# Patient Record
Sex: Female | Born: 1970 | Race: Black or African American | Hispanic: No | Marital: Single | Smoking: Heavy tobacco smoker
Health system: Southern US, Community
[De-identification: ages and names within clinical notes are randomized; demographics above are authoritative.]

## PROBLEM LIST (undated history)

## (undated) DIAGNOSIS — K589 Irritable bowel syndrome without diarrhea: Secondary | ICD-10-CM

## (undated) DIAGNOSIS — Z5189 Encounter for other specified aftercare: Secondary | ICD-10-CM

## (undated) DIAGNOSIS — G5600 Carpal tunnel syndrome, unspecified upper limb: Secondary | ICD-10-CM

## (undated) HISTORY — PX: ABDOMINAL HYSTERECTOMY: SHX81

## (undated) HISTORY — DX: Irritable bowel syndrome, unspecified: K58.9

## (undated) HISTORY — DX: Carpal tunnel syndrome, unspecified upper limb: G56.00

## (undated) HISTORY — PX: SPINE SURGERY: SHX786

---

## 2011-03-26 ENCOUNTER — Emergency Department (HOSPITAL_COMMUNITY): Payer: No Typology Code available for payment source

## 2011-03-26 ENCOUNTER — Emergency Department (HOSPITAL_COMMUNITY)
Admission: EM | Admit: 2011-03-26 | Discharge: 2011-03-26 | Disposition: A | Discharge status: Home / Self Care | Payer: No Typology Code available for payment source | Attending: Emergency Medicine | Admitting: Emergency Medicine

## 2011-03-26 DIAGNOSIS — M545 Low back pain, unspecified: Secondary | ICD-10-CM | POA: Insufficient documentation

## 2011-03-26 DIAGNOSIS — G8929 Other chronic pain: Secondary | ICD-10-CM | POA: Insufficient documentation

## 2011-03-26 DIAGNOSIS — M542 Cervicalgia: Secondary | ICD-10-CM | POA: Insufficient documentation

## 2011-03-26 DIAGNOSIS — Z79899 Other long term (current) drug therapy: Secondary | ICD-10-CM | POA: Insufficient documentation

## 2014-04-16 ENCOUNTER — Encounter: Payer: Self-pay | Admitting: Internal Medicine

## 2014-04-16 ENCOUNTER — Telehealth: Payer: Self-pay

## 2014-04-16 ENCOUNTER — Ambulatory Visit: Payer: Medicaid Other | Attending: Internal Medicine | Admitting: Internal Medicine

## 2014-04-16 VITALS — BP 130/91 | HR 63 | Temp 98.4°F | Resp 17 | Wt 120.8 lb

## 2014-04-16 DIAGNOSIS — G894 Chronic pain syndrome: Secondary | ICD-10-CM | POA: Insufficient documentation

## 2014-04-16 DIAGNOSIS — K219 Gastro-esophageal reflux disease without esophagitis: Secondary | ICD-10-CM | POA: Diagnosis not present

## 2014-04-16 DIAGNOSIS — Z139 Encounter for screening, unspecified: Secondary | ICD-10-CM

## 2014-04-16 DIAGNOSIS — Z9889 Other specified postprocedural states: Secondary | ICD-10-CM | POA: Insufficient documentation

## 2014-04-16 DIAGNOSIS — G4700 Insomnia, unspecified: Secondary | ICD-10-CM | POA: Insufficient documentation

## 2014-04-16 DIAGNOSIS — D649 Anemia, unspecified: Secondary | ICD-10-CM | POA: Insufficient documentation

## 2014-04-16 DIAGNOSIS — K589 Irritable bowel syndrome without diarrhea: Secondary | ICD-10-CM | POA: Insufficient documentation

## 2014-04-16 DIAGNOSIS — Z862 Personal history of diseases of the blood and blood-forming organs and certain disorders involving the immune mechanism: Secondary | ICD-10-CM | POA: Insufficient documentation

## 2014-04-16 DIAGNOSIS — F17210 Nicotine dependence, cigarettes, uncomplicated: Secondary | ICD-10-CM | POA: Insufficient documentation

## 2014-04-16 DIAGNOSIS — F17200 Nicotine dependence, unspecified, uncomplicated: Secondary | ICD-10-CM

## 2014-04-16 DIAGNOSIS — G5600 Carpal tunnel syndrome, unspecified upper limb: Secondary | ICD-10-CM | POA: Insufficient documentation

## 2014-04-16 DIAGNOSIS — F419 Anxiety disorder, unspecified: Secondary | ICD-10-CM | POA: Insufficient documentation

## 2014-04-16 DIAGNOSIS — F418 Other specified anxiety disorders: Secondary | ICD-10-CM | POA: Insufficient documentation

## 2014-04-16 DIAGNOSIS — Z720 Tobacco use: Secondary | ICD-10-CM

## 2014-04-16 MED ORDER — NICOTINE POLACRILEX 4 MG MT GUM: 4.0000 mg | CHEWING_GUM | OROMUCOSAL | Status: AC | PRN

## 2014-04-16 NOTE — Progress Notes (Signed)
Patient here to establish care Has history of hysterectomy , IBS and carpal tunnel [place] into her [place]  Patient refused flu vaccine

## 2014-04-16 NOTE — Telephone Encounter (Signed)
left vm for patient regarding appt with Dr. Clearance CootsHarper 11/24 at 2:30pm
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
## 2014-04-17 ENCOUNTER — Telehealth: Payer: Self-pay | Admitting: *Deleted

## 2014-04-17 LAB — ANEMIA PANEL
%SAT: 16 % — AB (ref 20–55)
ABS Retic: 43.4 10*3/uL (ref 19.0–186.0)
FOLATE: 13.6 ng/mL
Ferritin: 123 ng/mL (ref 10–291)
IRON: 59 ug/dL (ref 42–145)
RBC.: 4.34 MIL/uL (ref 3.87–5.11)
RETIC CT PCT: 1 % (ref 0.4–2.3)
TIBC: 362 ug/dL (ref 250–470)
UIBC: 303 ug/dL (ref 125–400)
VITAMIN B 12: 606 pg/mL (ref 211–911)

## 2014-04-17 NOTE — Telephone Encounter (Signed)
LCSW spoke with patient about psychiatry medications. Patient stated that she needed all of her medications.  LCSW provided clarity that mental health medications should be managed by a psychiatrist and that pain medications would be managed by a pain management specialist and that general health medications will be managed by the PCP. LCSW also shared with patient that her PCP had made referral to pain management and they will contact her about the referral. LCSW provided the name of the clinic so that patient will be aware.  LCSW encouraged patient to contact medicaid directly [facility] to identify which psychiatrist accept medicaid.   Please follow up with patient about refills on her other medications.  Beverly Sessionsywan J Angelica Smith MSW, LCSW

## 2014-05-22 ENCOUNTER — Encounter (HOSPITAL_COMMUNITY): Payer: Self-pay | Admitting: Emergency Medicine

## 2014-05-22 ENCOUNTER — Emergency Department (HOSPITAL_COMMUNITY)
Admission: EM | Admit: 2014-05-22 | Discharge: 2014-05-22 | Disposition: A | Discharge status: Home / Self Care | Payer: Medicaid Other | Attending: Emergency Medicine | Admitting: Emergency Medicine

## 2014-05-22 ENCOUNTER — Telehealth: Payer: Self-pay | Admitting: Internal Medicine

## 2014-05-22 DIAGNOSIS — M545 Low back pain: Secondary | ICD-10-CM | POA: Insufficient documentation

## 2014-05-22 DIAGNOSIS — Z9889 Other specified postprocedural states: Secondary | ICD-10-CM | POA: Diagnosis not present

## 2014-05-22 DIAGNOSIS — G8918 Other acute postprocedural pain: Secondary | ICD-10-CM | POA: Diagnosis not present

## 2014-05-22 DIAGNOSIS — Z720 Tobacco use: Secondary | ICD-10-CM | POA: Diagnosis not present

## 2014-05-22 DIAGNOSIS — Z8719 Personal history of other diseases of the digestive system: Secondary | ICD-10-CM | POA: Insufficient documentation

## 2014-05-22 DIAGNOSIS — Z7951 Long term (current) use of inhaled steroids: Secondary | ICD-10-CM | POA: Insufficient documentation

## 2014-05-22 DIAGNOSIS — Z79899 Other long term (current) drug therapy: Secondary | ICD-10-CM | POA: Diagnosis not present

## 2014-05-22 DIAGNOSIS — Z8669 Personal history of other diseases of the nervous system and sense organs: Secondary | ICD-10-CM | POA: Insufficient documentation

## 2014-05-22 DIAGNOSIS — G8929 Other chronic pain: Secondary | ICD-10-CM | POA: Insufficient documentation

## 2014-05-22 MED ORDER — HYDROMORPHONE HCL 1 MG/ML IJ SOLN
1.0000 mg | Freq: Once | INTRAMUSCULAR | Status: AC
  Administered 2014-05-22: 1 mg via INTRAVENOUS
  Filled 2014-05-22: qty 1

## 2014-05-22 MED ORDER — LORAZEPAM 2 MG/ML IJ SOLN
0.5000 mg | Freq: Once | INTRAMUSCULAR | Status: AC
  Administered 2014-05-22: 0.5 mg via INTRAVENOUS
  Filled 2014-05-22: qty 1

## 2014-05-22 MED ORDER — ONDANSETRON HCL 4 MG/2ML IJ SOLN
4.0000 mg | Freq: Once | INTRAMUSCULAR | Status: AC
  Administered 2014-05-22: 4 mg via INTRAVENOUS
  Filled 2014-05-22: qty 2

## 2014-05-22 MED ORDER — KETOROLAC TROMETHAMINE 15 MG/ML IJ SOLN
15.0000 mg | Freq: Once | INTRAMUSCULAR | Status: AC
  Administered 2014-05-22: 15 mg via INTRAVENOUS
  Filled 2014-05-22: qty 1

## 2014-05-22 MED ORDER — SODIUM CHLORIDE 0.9 % IV BOLUS (SEPSIS)
1000.0000 mL | Freq: Once | INTRAVENOUS | Status: AC
  Administered 2014-05-22: 1000 mL via INTRAVENOUS

## 2014-05-22 MED ORDER — HYDROMORPHONE HCL 1 MG/ML IJ SOLN
0.5000 mg | Freq: Once | INTRAMUSCULAR | Status: AC
  Administered 2014-05-22: 0.5 mg via INTRAVENOUS
  Filled 2014-05-22: qty 1

## 2014-05-22 MED ORDER — DIAZEPAM 5 MG PO TABS: 5.0000 mg | ORAL_TABLET | Freq: Four times a day (QID) | ORAL | Status: DC | PRN

## 2014-05-22 MED ORDER — OXYCODONE HCL ER 20 MG PO T12A: 20.0000 mg | EXTENDED_RELEASE_TABLET | Freq: Two times a day (BID) | ORAL | Status: DC

## 2014-05-22 MED ORDER — PREGABALIN 75 MG PO CAPS: 75.0000 mg | ORAL_CAPSULE | Freq: Two times a day (BID) | ORAL | Status: DC

## 2014-05-22 MED ORDER — OXYMORPHONE HCL ER 10 MG PO TB12: 10.0000 mg | ORAL_TABLET | Freq: Two times a day (BID) | ORAL | Status: DC

## 2014-05-22 NOTE — Discharge Instructions (Signed)
Back Pain, Adult Low back pain is very common. About 1 in 5 people have back pain.The cause of low back pain is rarely dangerous. The pain often gets better over time.About half of people with a sudden onset of back pain feel better in just 2 weeks. About 8 in 10 people feel better by 6 weeks.  CAUSES Some common causes of back pain include:  Strain of the muscles or ligaments supporting the spine.  Wear and tear (degeneration) of the spinal discs.  Arthritis.  Direct injury to the back. DIAGNOSIS Most of the time, the direct cause of low back pain is not known.However, back pain can be treated effectively even when the exact cause of the pain is unknown.Answering your caregiver's questions about your overall health and symptoms is one of the most accurate ways to make sure the cause of your pain is not dangerous. If your caregiver needs more information, he or she may order lab work or imaging tests (X-rays or MRIs).However, even if imaging tests show changes in your back, this usually does not require surgery. HOME CARE INSTRUCTIONS For many people, back pain returns.Since low back pain is rarely dangerous, it is often a condition that people can learn to manageon their own.   Remain active. It is stressful on the back to sit or stand in one place. Do not sit, drive, or stand in one place for more than 30 minutes at a time. Take short walks on level surfaces as soon as pain allows.Try to increase the length of time you walk each day.  Do not stay in bed.Resting more than 1 or 2 days can delay your recovery.  Do not avoid exercise or work.Your body is made to move.It is not dangerous to be active, even though your back may hurt.Your back will likely heal faster if you return to being active before your pain is gone.  Pay attention to your body when you bend and lift. Many people have less discomfortwhen lifting if they bend their knees, keep the load close to their bodies,and  avoid twisting. Often, the most comfortable positions are those that put less stress on your recovering back.  Find a comfortable position to sleep. Use a firm mattress and lie on your side with your knees slightly bent. If you lie on your back, put a pillow under your knees.  Only take over-the-counter or prescription medicines as directed by your caregiver. Over-the-counter medicines to reduce pain and inflammation are often the most helpful.Your caregiver may prescribe muscle relaxant drugs.These medicines help dull your pain so you can more quickly return to your normal activities and healthy exercise.  Put ice on the injured area.  Put ice in a plastic bag.  Place a towel between your skin and the bag.  Leave the ice on for 15-20 minutes, 03-04 times a day for the first 2 to 3 days. After that, ice and heat may be alternated to reduce pain and spasms.  Ask your caregiver about trying back exercises and gentle massage. This may be of some benefit.  Avoid feeling anxious or stressed.Stress increases muscle tension and can worsen back pain.It is important to recognize when you are anxious or stressed and learn ways to manage it.Exercise is a great option. SEEK MEDICAL CARE IF:  You have pain that is not relieved with rest or medicine.  You have pain that does not improve in 1 week.  You have new symptoms.  You are generally not feeling well. SEEK   IMMEDIATE MEDICAL CARE IF:   You have pain that radiates from your back into your legs.  You develop new bowel or bladder control problems.  You have unusual weakness or numbness in your arms or legs.  You develop nausea or vomiting.  You develop abdominal pain.  You feel faint. Document Released: 06/28/2005 Document Revised: 12/28/2011 Document Reviewed: 10/30/2013 ExitCare Patient Information 2015 [employer]. This information is not intended to replace advice given to you by your health care provider. Make sure you  discuss any questions you have with your health care provider.  

## 2014-05-22 NOTE — Telephone Encounter (Signed)
Pt's sister is calling to request another referral to a different pain management facility([facility]), pt's sister states that pt. Is currently in hospital due to the injections the pain management doctor gave the pt., Pt's sister would like to get a referral to [facility], or [facility]. Please f/u with family member.

## 2014-05-22 NOTE — ED Notes (Signed)
Bed: [id number] Expected date:  Expected time:  Means of arrival:  Comments: EMS- back pain, recently outpatient procedure

## 2014-05-22 NOTE — ED Notes (Addendum)
Per EMS pt comes from home c/o back pain after having radiofrequency to L[facility] and L5 [facility].  Pt states that she wasn't given any pain meds.  Pt laying on her left side and unable to lay supine position.  Pt states that this morning she was unable to get out of bed.  Pt was given 150mcg Fentanyl in route. After last dose of Fentanyl pt's BP dropped to 88/58 sp pt was given 500ml NS bolus. Then BP came up to 108/56
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
## 2014-05-24 NOTE — Telephone Encounter (Signed)
Expand All Collapse All   Pt's sister is calling to request another referral to a different pain management facility([facility]), pt's sister states that pt. Is currently in hospital due to the injections the pain management doctor gave the pt., Pt's sister would like to get a referral to [facility], or [facility]. Please f/u with family member.             Please f/u

## 2014-05-31 ENCOUNTER — Telehealth: Payer: Self-pay | Admitting: Internal Medicine

## 2014-05-31 NOTE — Telephone Encounter (Signed)
Pt. Came into facility wanting to speak to nurse, pt. States that she is nauseous and would like some medication to treat. Pt would like medication sent to [facility]. States that she received an injection from [facility] and lost feeling in her back and would like to change to a different pain management facility. Pt. States that she is [facility]. Please f/u with pt.

## 2014-06-04 ENCOUNTER — Ambulatory Visit: Payer: Medicaid Other | Admitting: Obstetrics

## 2014-06-05 NOTE — Telephone Encounter (Signed)
Pt. Came into facility wanting to speak to nurse, pt. States that she is nauseous and would like some medication to treat. Pt would like medication sent to [facility]. States that she received an injection from [facility] and lost feeling in her back and would like to change to a different pain management facility. Pt. States that she is unhappy with the care she received at [facility]. Please f/u with pt. °

## 2020-09-09 ENCOUNTER — Encounter (HOSPITAL_COMMUNITY): Payer: Self-pay

## 2020-09-09 ENCOUNTER — Other Ambulatory Visit: Payer: Self-pay

## 2020-09-09 ENCOUNTER — Emergency Department (HOSPITAL_COMMUNITY): Payer: Medicaid Other

## 2020-09-09 ENCOUNTER — Emergency Department (HOSPITAL_COMMUNITY)
Admission: EM | Admit: 2020-09-09 | Discharge: 2020-09-09 | Disposition: A | Discharge status: Home / Self Care | Payer: Medicaid Other | Attending: Emergency Medicine | Admitting: Emergency Medicine

## 2020-09-09 DIAGNOSIS — R200 Anesthesia of skin: Secondary | ICD-10-CM | POA: Insufficient documentation

## 2020-09-09 DIAGNOSIS — M545 Low back pain, unspecified: Secondary | ICD-10-CM | POA: Insufficient documentation

## 2020-09-09 DIAGNOSIS — F17210 Nicotine dependence, cigarettes, uncomplicated: Secondary | ICD-10-CM | POA: Insufficient documentation

## 2020-09-09 DIAGNOSIS — M79605 Pain in left leg: Secondary | ICD-10-CM | POA: Diagnosis not present

## 2020-09-09 DIAGNOSIS — M79604 Pain in right leg: Secondary | ICD-10-CM | POA: Insufficient documentation

## 2020-09-09 LAB — URINALYSIS, ROUTINE W REFLEX MICROSCOPIC
Bacteria, UA: NONE SEEN
Bilirubin Urine: NEGATIVE
Glucose, UA: NEGATIVE mg/dL
Hgb urine dipstick: NEGATIVE
Ketones, ur: NEGATIVE mg/dL
Nitrite: NEGATIVE
Protein, ur: NEGATIVE mg/dL
Specific Gravity, Urine: 1.012 (ref 1.005–1.030)
pH: 5 (ref 5.0–8.0)

## 2020-09-09 LAB — POC URINE PREG, ED: Preg Test, Ur: NEGATIVE

## 2020-09-09 MED ORDER — LIDOCAINE 5 % EX PTCH
2.0000 | MEDICATED_PATCH | CUTANEOUS | Status: DC
  Administered 2020-09-09: 2 via TRANSDERMAL
  Filled 2020-09-09: qty 2

## 2020-09-09 MED ORDER — KETOROLAC TROMETHAMINE 30 MG/ML IJ SOLN
30.0000 mg | Freq: Once | INTRAMUSCULAR | Status: AC
  Administered 2020-09-09: 30 mg via INTRAVENOUS
  Filled 2020-09-09: qty 1

## 2020-09-09 MED ORDER — DIAZEPAM 5 MG PO TABS
5.0000 mg | ORAL_TABLET | Freq: Once | ORAL | Status: AC
  Administered 2020-09-09: 5 mg via ORAL
  Filled 2020-09-09: qty 1

## 2020-09-09 MED ORDER — DEXAMETHASONE SODIUM PHOSPHATE 10 MG/ML IJ SOLN
10.0000 mg | Freq: Once | INTRAMUSCULAR | Status: AC
  Administered 2020-09-09: 10 mg via INTRAVENOUS
  Filled 2020-09-09: qty 1

## 2020-09-09 MED ORDER — ONDANSETRON HCL 4 MG/2ML IJ SOLN
4.0000 mg | Freq: Once | INTRAMUSCULAR | Status: AC
  Administered 2020-09-09: 4 mg via INTRAVENOUS
  Filled 2020-09-09: qty 2

## 2020-09-09 MED ORDER — FENTANYL CITRATE (PF) 100 MCG/2ML IJ SOLN
100.0000 ug | Freq: Once | INTRAMUSCULAR | Status: AC
  Administered 2020-09-09: 100 ug via INTRAVENOUS
  Filled 2020-09-09: qty 2

## 2020-09-09 NOTE — ED Triage Notes (Addendum)
Pt sts numbness from hips to bilateral feet beginning at 1400 yesterday. Pt reports sitting on the couch and heaping a "pop". Hx of rods and hardware in lumbar region of lower back. Walker left in [place] pt sts she has been ambulating with crutches.
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
## 2020-09-09 NOTE — ED Provider Notes (Signed)
MRI negative for acute neurologic emergency.  Patient is feeling improved, and she is able to ambulate with assistance.  Patient reports she typically uses a walker for ambulation, but left it in [place] and has no access to one.  I have ordered home health for patient as well as a walker, she has been referred to local spine specialist.   Zadie Rhine, MD 09/09/20 [phone]

## 2020-09-09 NOTE — ED Provider Notes (Signed)
I received patient in transfer from [facility].  Patient is here for MRI of the thoracic and lumbar spine.  Patient has long history of back pain with previous surgery.  Patient is awake and alert at this time.  She can move both lower extremities but limited due to pain.  MRI imaging is pending at this time   Zadie Rhine, MD 09/09/20 [phone]

## 2021-01-07 ENCOUNTER — Other Ambulatory Visit: Payer: Self-pay | Admitting: Neurosurgery

## 2021-01-07 DIAGNOSIS — Z981 Arthrodesis status: Secondary | ICD-10-CM

## 2021-04-01 ENCOUNTER — Other Ambulatory Visit: Payer: Medicaid Other

## 2021-04-14 ENCOUNTER — Ambulatory Visit
Admission: RE | Admit: 2021-04-14 | Discharge: 2021-04-14 | Disposition: A | Discharge status: Home / Self Care | Payer: Medicaid Other | Source: Ambulatory Visit | Attending: Neurosurgery | Admitting: Neurosurgery

## 2021-04-14 ENCOUNTER — Other Ambulatory Visit: Payer: Self-pay

## 2021-04-14 DIAGNOSIS — Z981 Arthrodesis status: Secondary | ICD-10-CM

## 2022-01-18 ENCOUNTER — Encounter (HOSPITAL_COMMUNITY): Payer: Self-pay | Admitting: Emergency Medicine

## 2022-01-18 ENCOUNTER — Other Ambulatory Visit: Payer: Self-pay

## 2022-01-18 ENCOUNTER — Emergency Department (HOSPITAL_COMMUNITY): Payer: Self-pay

## 2022-01-18 ENCOUNTER — Emergency Department (HOSPITAL_COMMUNITY)
Admission: EM | Admit: 2022-01-18 | Discharge: 2022-01-18 | Disposition: A | Discharge status: Home / Self Care | Payer: Self-pay | Attending: Emergency Medicine | Admitting: Emergency Medicine

## 2022-01-18 DIAGNOSIS — R1110 Vomiting, unspecified: Secondary | ICD-10-CM | POA: Insufficient documentation

## 2022-01-18 DIAGNOSIS — R195 Other fecal abnormalities: Secondary | ICD-10-CM | POA: Insufficient documentation

## 2022-01-18 DIAGNOSIS — R1084 Generalized abdominal pain: Secondary | ICD-10-CM | POA: Insufficient documentation

## 2022-01-18 DIAGNOSIS — Z760 Encounter for issue of repeat prescription: Secondary | ICD-10-CM | POA: Insufficient documentation

## 2022-01-18 LAB — CBC WITH DIFFERENTIAL/PLATELET
Abs Immature Granulocytes: 0.01 K/uL (ref 0.00–0.07)
Basophils Absolute: 0.1 K/uL (ref 0.0–0.1)
Basophils Relative: 1 %
Eosinophils Absolute: 0.1 K/uL (ref 0.0–0.5)
Eosinophils Relative: 1 %
HCT: 42.4 % (ref 36.0–46.0)
Hemoglobin: 14.1 g/dL (ref 12.0–15.0)
Immature Granulocytes: 0 %
Lymphocytes Relative: 63 %
Lymphs Abs: 3.7 K/uL (ref 0.7–4.0)
MCH: 33.3 pg (ref 26.0–34.0)
MCHC: 33.3 g/dL (ref 30.0–36.0)
MCV: 100.2 fL — ABNORMAL HIGH (ref 80.0–100.0)
Monocytes Absolute: 0.4 K/uL (ref 0.1–1.0)
Monocytes Relative: 6 %
Neutro Abs: 1.7 K/uL (ref 1.7–7.7)
Neutrophils Relative %: 29 %
Platelets: 271 K/uL (ref 150–400)
RBC: 4.23 MIL/uL (ref 3.87–5.11)
RDW: 12.7 % (ref 11.5–15.5)
WBC: 5.8 K/uL (ref 4.0–10.5)
nRBC: 0 % (ref 0.0–0.2)

## 2022-01-18 LAB — COMPREHENSIVE METABOLIC PANEL WITH GFR
ALT: 15 U/L (ref 0–44)
AST: 24 U/L (ref 15–41)
Albumin: 4.4 g/dL (ref 3.5–5.0)
Alkaline Phosphatase: 68 U/L (ref 38–126)
Anion gap: 7 (ref 5–15)
BUN: 10 mg/dL (ref 6–20)
CO2: 30 mmol/L (ref 22–32)
Calcium: 10 mg/dL (ref 8.9–10.3)
Chloride: 103 mmol/L (ref 98–111)
Creatinine, Ser: 0.93 mg/dL (ref 0.44–1.00)
GFR, Estimated: >60 mL/min
Glucose, Bld: 92 mg/dL (ref 70–99)
Potassium: 4.2 mmol/L (ref 3.5–5.1)
Sodium: 140 mmol/L (ref 135–145)
Total Bilirubin: 0.7 mg/dL (ref 0.3–1.2)
Total Protein: 7.6 g/dL (ref 6.5–8.1)

## 2022-01-18 LAB — URINALYSIS, ROUTINE W REFLEX MICROSCOPIC
Bilirubin Urine: NEGATIVE
Glucose, UA: NEGATIVE mg/dL
Ketones, ur: NEGATIVE mg/dL
Nitrite: NEGATIVE
Protein, ur: NEGATIVE mg/dL
Specific Gravity, Urine: 1.017 (ref 1.005–1.030)
pH: 5 (ref 5.0–8.0)

## 2022-01-18 LAB — I-STAT BETA HCG BLOOD, ED (MC, WL, AP ONLY): I-stat hCG, quantitative: <5 m[IU]/mL (ref <5)

## 2022-01-18 LAB — LIPASE, BLOOD: Lipase: 24 U/L (ref 11–51)

## 2022-01-18 LAB — POC OCCULT BLOOD, ED: Fecal Occult Bld: NEGATIVE

## 2022-01-18 MED ORDER — HYDROMORPHONE HCL 1 MG/ML IJ SOLN
1.0000 mg | Freq: Once | INTRAMUSCULAR | Status: AC
  Administered 2022-01-18: 1 mg via INTRAVENOUS
  Filled 2022-01-18: qty 1

## 2022-01-18 MED ORDER — OXYCODONE-ACETAMINOPHEN 5-325 MG PO TABS
1.0000 | ORAL_TABLET | Freq: Once | ORAL | Status: DC
  Filled 2022-01-18: qty 1

## 2022-01-18 MED ORDER — DOCUSATE SODIUM 100 MG PO CAPS: 100.0000 mg | ORAL_CAPSULE | Freq: Two times a day (BID) | ORAL | 0 refills | Status: DC

## 2022-01-18 MED ORDER — PREGABALIN 200 MG PO CAPS: 200.0000 mg | ORAL_CAPSULE | Freq: Two times a day (BID) | ORAL | 0 refills | Status: DC

## 2022-01-18 MED ORDER — DICYCLOMINE HCL 20 MG PO TABS: 20.0000 mg | ORAL_TABLET | Freq: Two times a day (BID) | ORAL | 0 refills | Status: AC

## 2022-01-18 MED ORDER — ONDANSETRON 4 MG PO TBDP: 4.0000 mg | ORAL_TABLET | Freq: Three times a day (TID) | ORAL | 0 refills | Status: DC | PRN

## 2022-01-18 MED ORDER — LACTATED RINGERS IV BOLUS
1000.0000 mL | Freq: Once | INTRAVENOUS | Status: AC
  Administered 2022-01-18: 1000 mL via INTRAVENOUS

## 2022-01-18 MED ORDER — IOHEXOL 300 MG/ML  SOLN
100.0000 mL | Freq: Once | INTRAMUSCULAR | Status: AC | PRN
  Administered 2022-01-18: 100 mL via INTRAVENOUS

## 2022-01-18 MED ORDER — ERGOCALCIFEROL 1.25 MG (50000 UT) PO CAPS: 50000.0000 [IU] | ORAL_CAPSULE | ORAL | 0 refills | Status: DC

## 2022-01-18 MED ORDER — ONDANSETRON 4 MG PO TBDP
4.0000 mg | ORAL_TABLET | Freq: Once | ORAL | Status: DC
  Filled 2022-01-18: qty 1

## 2022-01-18 MED ORDER — SODIUM CHLORIDE (PF) 0.9 % IJ SOLN
INTRAMUSCULAR | Status: AC
  Filled 2022-01-18: qty 50

## 2022-01-18 MED ORDER — OMEPRAZOLE 20 MG PO CPDR: 20.0000 mg | DELAYED_RELEASE_CAPSULE | Freq: Every day | ORAL | 0 refills | Status: DC

## 2022-01-18 MED ORDER — DULOXETINE HCL 60 MG PO CPEP: 60.0000 mg | ORAL_CAPSULE | Freq: Every day | ORAL | 0 refills | Status: DC

## 2022-01-18 MED ORDER — ONDANSETRON HCL 4 MG/2ML IJ SOLN
4.0000 mg | Freq: Once | INTRAMUSCULAR | Status: AC
  Administered 2022-01-18: 4 mg via INTRAVENOUS
  Filled 2022-01-18: qty 2

## 2022-01-18 MED ORDER — SERTRALINE HCL 50 MG PO TABS: 50.0000 mg | ORAL_TABLET | Freq: Every day | ORAL | 0 refills | Status: DC

## 2022-01-18 MED ORDER — LINACLOTIDE 145 MCG PO CAPS: 145.0000 ug | ORAL_CAPSULE | Freq: Every day | ORAL | 0 refills | Status: DC
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
## 2022-01-18 NOTE — Discharge Instructions (Addendum)
If you develop worsening, continued, or recurrent abdominal pain, uncontrolled vomiting, fever, chest or back pain, or any other new/concerning symptoms then return to the ER for evaluation.  

## 2022-01-19 ENCOUNTER — Telehealth (HOSPITAL_COMMUNITY): Payer: Self-pay | Admitting: Emergency Medicine

## 2022-01-19 MED ORDER — DULOXETINE HCL 60 MG PO CPEP: 60.0000 mg | ORAL_CAPSULE | Freq: Every day | ORAL | 0 refills | Status: AC

## 2022-01-19 MED ORDER — SERTRALINE HCL 50 MG PO TABS: 50.0000 mg | ORAL_TABLET | Freq: Every day | ORAL | 0 refills | Status: AC

## 2022-01-19 MED ORDER — DOCUSATE SODIUM 100 MG PO CAPS: 100.0000 mg | ORAL_CAPSULE | Freq: Two times a day (BID) | ORAL | 0 refills | Status: AC

## 2022-01-19 MED ORDER — VITAMIN D (ERGOCALCIFEROL) 1.25 MG (50000 UNIT) PO CAPS: 50000.0000 [IU] | ORAL_CAPSULE | ORAL | 0 refills | Status: AC

## 2022-01-19 MED ORDER — PREGABALIN 200 MG PO CAPS: 200.0000 mg | ORAL_CAPSULE | Freq: Two times a day (BID) | ORAL | 0 refills | Status: AC

## 2022-01-19 MED ORDER — LINACLOTIDE 145 MCG PO CAPS: 145.0000 ug | ORAL_CAPSULE | Freq: Every day | ORAL | 0 refills | Status: AC

## 2022-01-19 MED ORDER — ONDANSETRON 4 MG PO TBDP: 4.0000 mg | ORAL_TABLET | Freq: Three times a day (TID) | ORAL | 0 refills | Status: AC | PRN

## 2022-01-19 MED ORDER — OMEPRAZOLE 20 MG PO CPDR: 20.0000 mg | DELAYED_RELEASE_CAPSULE | Freq: Every day | ORAL | 0 refills | Status: AC

## 2022-01-19 NOTE — Telephone Encounter (Signed)
Patient apparently called the facility stating that she could not afford her prescriptions at the pharmacy that her medications were sent to. Requests that these prescriptions be sent to [facility] instead. Changes made and prescriptions sent to this pharmacy. Patient made aware by secretary. No further needs or concerns.

## 2022-03-13 ENCOUNTER — Emergency Department (HOSPITAL_COMMUNITY)
Admission: EM | Admit: 2022-03-13 | Discharge: 2022-03-13 | Disposition: A | Discharge status: Home / Self Care | Payer: Self-pay | Attending: Emergency Medicine | Admitting: Emergency Medicine

## 2022-03-13 ENCOUNTER — Other Ambulatory Visit: Payer: Self-pay

## 2022-03-13 ENCOUNTER — Encounter (HOSPITAL_COMMUNITY): Payer: Self-pay

## 2022-03-13 DIAGNOSIS — L03116 Cellulitis of left lower limb: Secondary | ICD-10-CM | POA: Insufficient documentation

## 2022-03-13 HISTORY — DX: Encounter for other specified aftercare: Z51.89

## 2022-03-13 MED ORDER — DOXYCYCLINE HYCLATE 100 MG PO CAPS: 100.0000 mg | ORAL_CAPSULE | Freq: Two times a day (BID) | ORAL | 0 refills | Status: AC

## 2022-03-13 MED ORDER — KETOROLAC TROMETHAMINE 30 MG/ML IJ SOLN
15.0000 mg | Freq: Once | INTRAMUSCULAR | Status: AC
  Administered 2022-03-13: 15 mg via INTRAMUSCULAR
  Filled 2022-03-13: qty 1

## 2022-03-13 NOTE — ED Provider Notes (Signed)
[facility]-EMERGENCY DEPT Provider Note   CSN: [id number] Arrival date & time: 03/13/22  1531     History  Chief Complaint  Patient presents with   Insect Bite    Angelica Smith is a 51 y.o. female. With past medical history of IBS who presents to the emergency department with insect bite.  States last night around 6p she was outside when she felt that she got bit on the medial aspect of her left ankle. She states that she got into her car and then about 15 minutes later the area became hot and burning. She has since had redness and drainage from the wound. She describes having itching and severe burning. She has tried an OTC benadryl spray to the area. She denies fevers.    HPI     Home Medications Prior to Admission medications   Medication Sig Start Date End Date Taking? Authorizing Provider  doxycycline (VIBRAMYCIN) 100 MG capsule Take 1 capsule (100 mg total) by mouth 2 (two) times daily for 7 days. 03/13/22 03/20/22 Yes Cristopher Peru, PA-C  clonazePAM (KLONOPIN) 0.5 MG tablet Take 0.5 mg by mouth 2 (two) times daily as needed for anxiety.    [provider]  dicyclomine (BENTYL) 20 MG tablet Take 1 tablet (20 mg total) by mouth 2 (two) times daily. 01/18/22   Pricilla Loveless, MD  docusate sodium (COLACE) 100 MG capsule Take 1 capsule (100 mg total) by mouth every 12 (twelve) hours. 01/19/22   Smoot, Shawn Route, PA-C  DULoxetine (CYMBALTA) 60 MG capsule Take 1 capsule (60 mg total) by mouth daily. 01/19/22 02/18/22  Smoot, Shawn Route, PA-C  famotidine (PEPCID) 40 MG tablet Take 40 mg by mouth at bedtime.    [provider]  fluticasone (FLONASE) 50 MCG/ACT nasal spray Place 2 sprays into both nostrils daily.    [provider]  FLUTICASONE FUROATE IN Inhale into the lungs as needed. Two sprays each, once daily    [provider]  linaclotide (LINZESS) 145 MCG CAPS capsule Take 1 capsule (145 mcg total) by mouth daily before breakfast.  01/19/22   Smoot, Shawn Route, PA-C  loratadine (CLARITIN) 10 MG tablet Take 10 mg by mouth daily.    [provider]  MIRTAZAPINE PO Take 1 tablet by mouth at bedtime as needed (sleep).     [provider]  nicotine polacrilex (NICORETTE) 4 MG gum Take 1 each (4 mg total) by mouth as needed for smoking cessation. 04/16/14   Doris Cheadle, MD  omeprazole (PRILOSEC) 20 MG capsule Take 1 capsule (20 mg total) by mouth daily. 01/19/22 02/18/22  Smoot, Shawn Route, PA-C  ondansetron (ZOFRAN-ODT) 4 MG disintegrating tablet Take 1 tablet (4 mg total) by mouth every 8 (eight) hours as needed for nausea or vomiting. 01/19/22   Smoot, Shawn Route, PA-C  pregabalin (LYRICA) 200 MG capsule Take 1 capsule (200 mg total) by mouth 2 (two) times daily. 01/19/22   Smoot, Shawn Route, PA-C  sertraline (ZOLOFT) 50 MG tablet Take 1 tablet (50 mg total) by mouth daily. 01/19/22 02/18/22  Smoot, Shawn Route, PA-C  Vitamin D, Ergocalciferol, (DRISDOL) 1.25 MG (50000 UNIT) CAPS capsule Take 1 capsule (50,000 Units total) by mouth every 7 (seven) days. 01/19/22   Smoot, Shawn Route, PA-C      Allergies    Tylenol [acetaminophen]    Review of Systems   Review of Systems  Skin:  Positive for wound.  All other systems reviewed and are negative.  Physical Exam Updated Vital Signs BP 111/78 (BP Location: Right Arm)   Pulse 78   Temp 99 F (37.2 C) (Oral)   Resp 18   Ht 5\' 2"  (1.575 m)   Wt 54.4 kg   SpO2 98%   BMI 21.94 kg/m  Physical Exam Vitals and nursing note reviewed.  Constitutional:      General: She is not in acute distress.    Appearance: Normal appearance. She is normal weight. She is not ill-appearing or toxic-appearing.  HENT:     Head: Normocephalic and atraumatic.  Eyes:     General: No scleral icterus.    Extraocular Movements: Extraocular movements intact.  Cardiovascular:     Pulses: Normal pulses.  Pulmonary:     Effort: Pulmonary effort is normal. No respiratory distress.  Musculoskeletal:         General: Tenderness present. Normal range of motion.     Cervical back: Neck supple.     Right lower leg: No edema.     Left lower leg: No edema.  Skin:    General: Skin is warm and dry.     Capillary Refill: Capillary refill takes less than 2 seconds.     Findings: Erythema present. No rash.  Neurological:     General: No focal deficit present.     Mental Status: She is alert and oriented to person, place, and time. Mental status is at baseline.  Psychiatric:        Mood and Affect: Mood normal.        Behavior: Behavior normal.        Thought Content: Thought content normal.        Judgment: Judgment normal.      ED Results / Procedures / Treatments   Labs (all labs ordered are listed, but only abnormal results are displayed) Labs Reviewed - No data to display  EKG None  Radiology No results found.  Procedures Procedures   Medications Ordered in ED Medications  ketorolac (TORADOL) 30 MG/ML injection 15 mg (has no administration in time range)    ED Course/ Medical Decision Making/ A&P                           Medical Decision Making Risk Prescription drug management.  This patient presents to the ED with chief complaint(s) of insect bite with pertinent past medical history of IBS which further complicates the presenting complaint. The complaint involves an extensive differential diagnosis and also carries with it a high risk of complications and morbidity.    The differential diagnosis includes insect bite, cellulitis, abscess, osteomyelitis  Additional history obtained: Additional history obtained from  none available Records reviewed Care Everywhere/External Records  ED Course and Reassessment: 51 year old female who presents to the emergency department after presumed insect bite last night.  Physical exam is notable for what appears to be 2 bites to the medial left ankle just above the medial malleolus.  There is surrounding redness.  It is warm and  tender to palpation.  There is no underlying fluctuance or induration concerning for an abscess.  She has full range of motion of the ankle and is neurovascularly intact.  I have low suspicion for osteomyelitis so will defer imaging at this time.  I palpated over the Achilles tendon which is nontender.  She is ambulatory. There is some purulent drainage coming from the area so we will place her on doxycycline twice a day over the  next 5 days.  She is given return precautions for worsening symptoms or fever.  Also given Toradol here in the emergency department for pain relief.  Encouraged to use ibuprofen at home.  She verbalized understanding  Independent labs interpretation:  The following labs were independently interpreted: N/A  Independent visualization of imaging: - I independently visualized the following imaging with scope of interpretation limited to determining acute life threatening conditions related to emergency care: N/A, which revealed N/A  Consultation: - Consulted or discussed management/test interpretation w/ external professional: N/A  Consideration for admission or further workup: Considered x-ray for osteo, however there is no decreased range of motion, joint swelling or tenderness Social Determinants of health: None identified Final Clinical Impression(s) / ED Diagnoses Final diagnoses:  Cellulitis of left lower extremity    Rx / DC Orders ED Discharge Orders          Ordered    doxycycline (VIBRAMYCIN) 100 MG capsule  2 times daily        03/13/22 1604              Cristopher Peru, PA-C 03/13/22 1611    Lorre Nick, MD 03/14/22 [phone]

## 2022-03-13 NOTE — Discharge Instructions (Signed)
You were seen in the emergency department for an insect bite. It appears you have an overlying skin infection from this bite. I am placing you on doxycycline which you will take twice a day for the next 7 days. Please return if you have fever, significant increase in swelling or purulent/pus-like drainage from the wound.

## 2022-03-13 NOTE — ED Triage Notes (Addendum)
Redness and swelling to left ankle pt states got bit by something last night. Left ankle warm to touch.

## 2022-05-15 ENCOUNTER — Emergency Department (HOSPITAL_COMMUNITY): Payer: Medicaid - Out of State

## 2022-05-15 ENCOUNTER — Emergency Department (HOSPITAL_COMMUNITY)
Admission: EM | Admit: 2022-05-15 | Discharge: 2022-05-16 | Disposition: A | Discharge status: Home / Self Care | Payer: Medicaid Other | Attending: Emergency Medicine | Admitting: Emergency Medicine

## 2022-05-15 DIAGNOSIS — S161XXA Strain of muscle, fascia and tendon at neck level, initial encounter: Secondary | ICD-10-CM | POA: Diagnosis not present

## 2022-05-15 DIAGNOSIS — R519 Headache, unspecified: Secondary | ICD-10-CM | POA: Diagnosis present

## 2022-05-15 DIAGNOSIS — S060X1A Concussion with loss of consciousness of 30 minutes or less, initial encounter: Secondary | ICD-10-CM | POA: Insufficient documentation

## 2022-05-15 DIAGNOSIS — M7918 Myalgia, other site: Secondary | ICD-10-CM

## 2022-05-15 DIAGNOSIS — Y92410 Unspecified street and highway as the place of occurrence of the external cause: Secondary | ICD-10-CM | POA: Diagnosis not present

## 2022-05-15 LAB — I-STAT CHEM 8, ED
BUN: 16 mg/dL (ref 6–20)
Calcium, Ion: 1.02 mmol/L — ABNORMAL LOW (ref 1.15–1.40)
Chloride: 101 mmol/L (ref 98–111)
Creatinine, Ser: 0.9 mg/dL (ref 0.44–1.00)
Glucose, Bld: 95 mg/dL (ref 70–99)
HCT: 46 % (ref 36.0–46.0)
Hemoglobin: 15.6 g/dL — ABNORMAL HIGH (ref 12.0–15.0)
Potassium: 3.9 mmol/L (ref 3.5–5.1)
Sodium: 137 mmol/L (ref 135–145)
TCO2: 28 mmol/L (ref 22–32)

## 2022-05-15 MED ORDER — ONDANSETRON HCL 4 MG/2ML IJ SOLN
4.0000 mg | Freq: Once | INTRAMUSCULAR | Status: AC
  Administered 2022-05-16: 4 mg via INTRAVENOUS
  Filled 2022-05-15: qty 2

## 2022-05-15 MED ORDER — MORPHINE SULFATE (PF) 4 MG/ML IV SOLN
4.0000 mg | Freq: Once | INTRAVENOUS | Status: AC
  Administered 2022-05-16: 4 mg via INTRAVENOUS
  Filled 2022-05-15: qty 1
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
## 2022-05-15 NOTE — ED Notes (Signed)
Patient transported to X-ray 
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
## 2022-05-16 ENCOUNTER — Emergency Department (HOSPITAL_COMMUNITY): Payer: Medicaid Other

## 2022-05-16 LAB — URINALYSIS, ROUTINE W REFLEX MICROSCOPIC
Bilirubin Urine: NEGATIVE
Glucose, UA: NEGATIVE mg/dL
Ketones, ur: NEGATIVE mg/dL
Nitrite: NEGATIVE
Protein, ur: NEGATIVE mg/dL
Specific Gravity, Urine: 1.01 (ref 1.005–1.030)
pH: 5 (ref 5.0–8.0)

## 2022-05-16 LAB — CBC
HCT: 43.1 % (ref 36.0–46.0)
Hemoglobin: 14.6 g/dL (ref 12.0–15.0)
MCH: 33.8 pg (ref 26.0–34.0)
MCHC: 33.9 g/dL (ref 30.0–36.0)
MCV: 99.8 fL (ref 80.0–100.0)
Platelets: 270 10*3/uL (ref 150–400)
RBC: 4.32 MIL/uL (ref 3.87–5.11)
RDW: 12.9 % (ref 11.5–15.5)
WBC: 8.5 10*3/uL (ref 4.0–10.5)
nRBC: 0 % (ref 0.0–0.2)

## 2022-05-16 LAB — COMPREHENSIVE METABOLIC PANEL
ALT: 22 U/L (ref 0–44)
AST: 33 U/L (ref 15–41)
Albumin: 4.3 g/dL (ref 3.5–5.0)
Alkaline Phosphatase: 70 U/L (ref 38–126)
Anion gap: 13 (ref 5–15)
BUN: 11 mg/dL (ref 6–20)
CO2: 26 mmol/L (ref 22–32)
Calcium: 9.9 mg/dL (ref 8.9–10.3)
Chloride: 100 mmol/L (ref 98–111)
Creatinine, Ser: 0.96 mg/dL (ref 0.44–1.00)
GFR, Estimated: >60 mL/min (ref >60)
Glucose, Bld: 112 mg/dL — ABNORMAL HIGH (ref 70–99)
Potassium: 3.9 mmol/L (ref 3.5–5.1)
Sodium: 139 mmol/L (ref 135–145)
Total Bilirubin: 0.7 mg/dL (ref 0.3–1.2)
Total Protein: 7.3 g/dL (ref 6.5–8.1)

## 2022-05-16 LAB — PROTIME-INR
INR: 1 (ref 0.8–1.2)
Prothrombin Time: 12.6 seconds (ref 11.4–15.2)

## 2022-05-16 LAB — LACTIC ACID, PLASMA: Lactic Acid, Venous: 1.1 mmol/L (ref 0.5–1.9)

## 2022-05-16 LAB — ETHANOL: Alcohol, Ethyl (B): <10 mg/dL (ref <10)

## 2022-05-16 MED ORDER — DICLOFENAC SODIUM 50 MG PO TBEC: 50.0000 mg | DELAYED_RELEASE_TABLET | Freq: Two times a day (BID) | ORAL | 0 refills | Status: AC

## 2022-05-16 MED ORDER — CYCLOBENZAPRINE HCL 10 MG PO TABS
5.0000 mg | ORAL_TABLET | Freq: Once | ORAL | Status: AC
  Administered 2022-05-16: 5 mg via ORAL

## 2022-05-16 MED ORDER — IOHEXOL 350 MG/ML SOLN
75.0000 mL | Freq: Once | INTRAVENOUS | Status: AC | PRN
  Administered 2022-05-16: 75 mL via INTRAVENOUS

## 2022-05-16 MED ORDER — CYCLOBENZAPRINE HCL 10 MG PO TABS: 10.0000 mg | ORAL_TABLET | Freq: Two times a day (BID) | ORAL | 0 refills | Status: AC | PRN

## 2022-05-16 NOTE — ED Notes (Signed)
Patient transported to CT 

## 2022-05-16 NOTE — Discharge Instructions (Addendum)
Follow up with Dr. Tamala Julian with the concussion clinic. Return to ER for worsening or concerning symptoms.  Recheck with your primary care provider.  Flexeril as needed as prescribed for muscle spasms. Do not drive or operate machinery while taking Flexeril. Diclofenac as needed as prescribed for pain. Take with food, discontinue use if you develop stomach pain.
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
## 2022-05-18 ENCOUNTER — Ambulatory Visit (INDEPENDENT_AMBULATORY_CARE_PROVIDER_SITE_OTHER): Payer: Medicaid Other | Admitting: Sports Medicine

## 2022-05-18 VITALS — BP 132/80 | HR 69 | Ht 62.0 in | Wt 127.0 lb

## 2022-05-18 DIAGNOSIS — S060X9A Concussion with loss of consciousness of unspecified duration, initial encounter: Secondary | ICD-10-CM | POA: Diagnosis not present

## 2022-05-18 DIAGNOSIS — G44319 Acute post-traumatic headache, not intractable: Secondary | ICD-10-CM | POA: Diagnosis not present

## 2022-05-18 DIAGNOSIS — M542 Cervicalgia: Secondary | ICD-10-CM

## 2022-05-18 MED ORDER — MELOXICAM 15 MG PO TABS: 15.0000 mg | ORAL_TABLET | Freq: Every day | ORAL | 0 refills | Status: AC

## 2022-05-18 NOTE — Patient Instructions (Addendum)
Good to see you    Relative mental and physical rest for 48 hours after concussive event -           Recommend light aerobic activity while keeping symptoms less than 3/10 -           Stop mental or physical activities that cause symptoms to worsen greater than 3/10, and wait 24 hours before attempting them again -           Eliminate screen time as much as possible for first 48 hours after concussive event, then continue limited screen time (recommend less than 2 hours per day) Neck HEP  - Start meloxicam 15 mg daily x2 weeks.  If still having pain after 2 weeks, complete 3rd-week of meloxicam. May use remaining meloxicam as needed once daily for pain control.  Do not to use additional NSAIDs while taking meloxicam.  May use Tylenol [phone] mg 2 to 3 times a day for breakthrough pain. 1 week follow up
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
## 2022-05-25 ENCOUNTER — Ambulatory Visit (INDEPENDENT_AMBULATORY_CARE_PROVIDER_SITE_OTHER): Payer: Medicaid Other | Admitting: Sports Medicine

## 2022-05-25 VITALS — BP 110/82 | HR 65 | Ht 62.0 in | Wt 131.0 lb

## 2022-05-25 DIAGNOSIS — M542 Cervicalgia: Secondary | ICD-10-CM

## 2022-05-25 DIAGNOSIS — G44319 Acute post-traumatic headache, not intractable: Secondary | ICD-10-CM | POA: Diagnosis not present

## 2022-05-25 DIAGNOSIS — R270 Ataxia, unspecified: Secondary | ICD-10-CM

## 2022-05-25 DIAGNOSIS — G4700 Insomnia, unspecified: Secondary | ICD-10-CM | POA: Diagnosis not present

## 2022-05-25 DIAGNOSIS — S060X9A Concussion with loss of consciousness of unspecified duration, initial encounter: Secondary | ICD-10-CM | POA: Diagnosis not present

## 2022-05-25 NOTE — Patient Instructions (Addendum)
Good to see you  Since you are returning to [place] I recommend you reach out to primary care office and as for physical therapy for your neck and vestibular therapy for your concussion  Recommend starting melatonin 1-5 mg nightly with the goal of 7-8 hours of sleep  Continue HEP for neck  Typically would do a 2 week follow up but since returning to [place] follow up as needed

## 2022-05-27 IMAGING — CT CT L SPINE W/O CM
1 of 7 series · 5 of 14 positions shown, 7 images · non-contrast
Comparison: MRI 09/09/2020

CLINICAL DATA: Lumbar region back pain

EXAM:
CT LUMBAR SPINE WITHOUT CONTRAST
TECHNIQUE: Multidetector CT imaging of the lumbar spine was performed without
intravenous contrast administration. Multiplanar CT image
reconstructions were also generated.

[Series 3: l spine soft · axial · 0.29mm/px · z∈[-263,-107]mm · 5 of 118 slices shown, 7 images]
[im 20/118  soft-tissue]
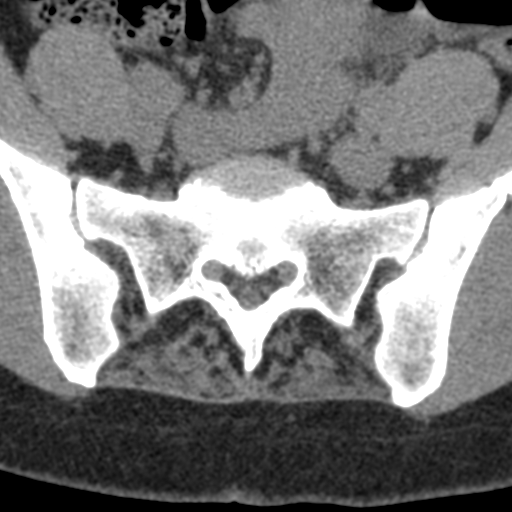
[im 20/118  bone]
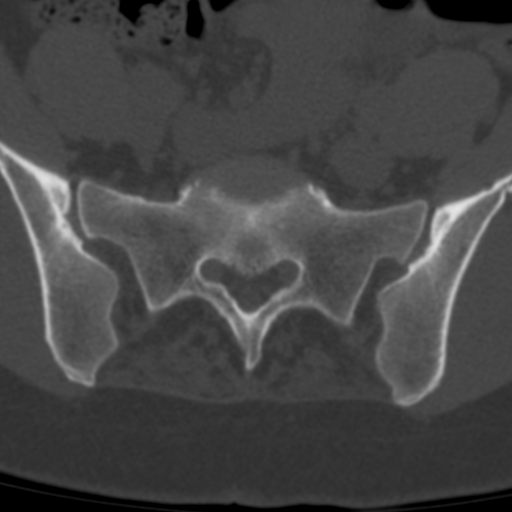
[im 40/118  bone]
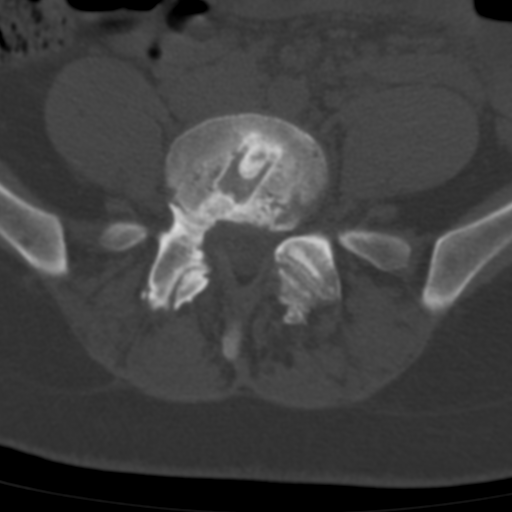
[im 59/118  bone]
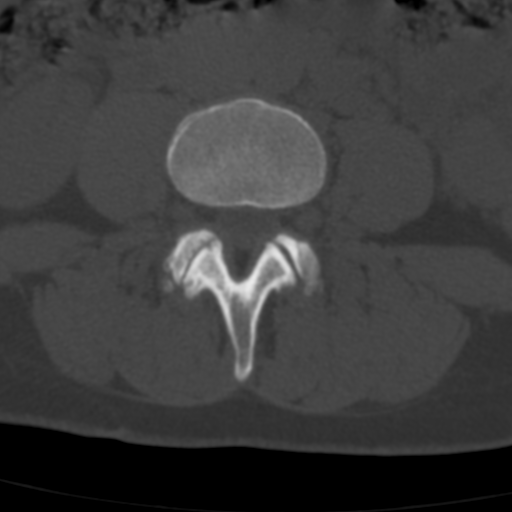
[im 79/118  bone]
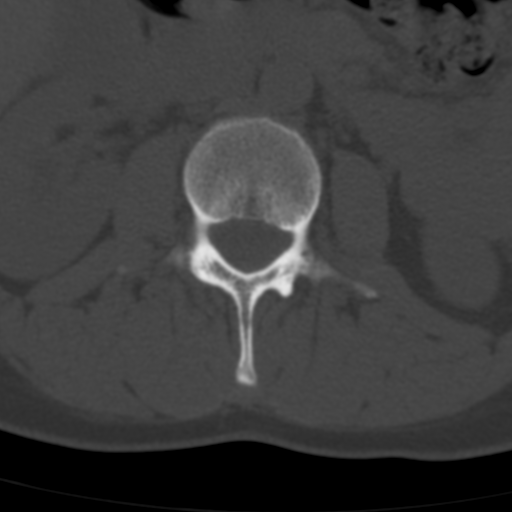
[im 98/118  soft-tissue]
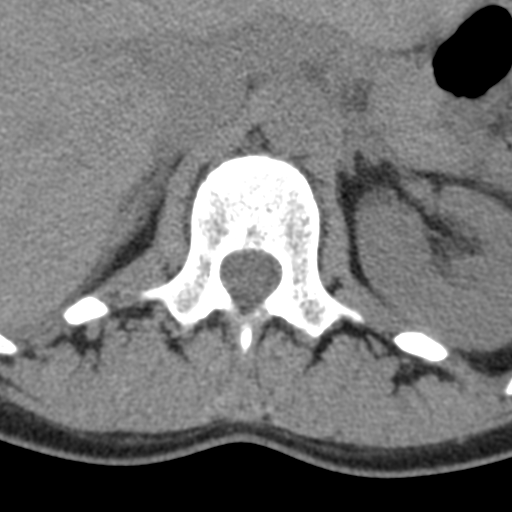
[im 98/118  bone]
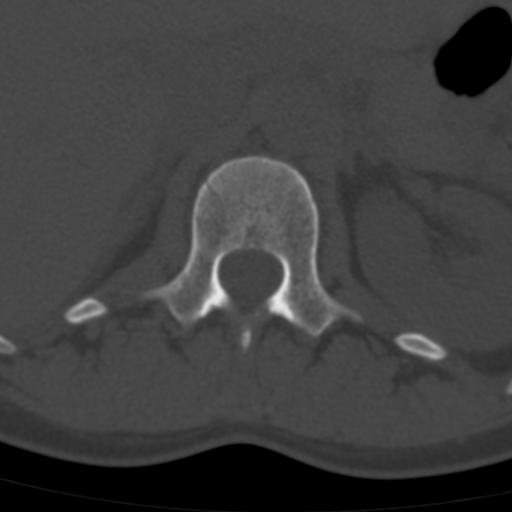

[5 of 14 positions shown; findings below may reference images not displayed]

FINDINGS: Segmentation: 5 lumbar type vertebral bodies. L5 is a transitional
vertebra.

Alignment: Normal

Vertebrae: Previous fusion surgery at L4-5.  See below.

Paraspinal and other soft tissues: Negative

Disc levels: No abnormality at L2-3 or above.

L3-4: Mild bulging of the disc. Bilateral facet degeneration and
hypertrophy, right worse than left. Mild narrowing of the
subarticular lateral recesses but no visible compressive stenosis.

L4-5: Previous posterior decompression, diskectomy and fusion
procedure. Think there is probably solid union particularly
posteriorly. The facets show degenerative change and hypertrophy,
right worse than left. The central canal is sufficiently patent.
Chronic bony foraminal narrowing on the right without visible
compression of the exiting L4 nerve.

L5-S1: Transitional level with bilateral transitional articulations.
No disc level pathology. Sufficient patency of the canal and
foramina. Mild facet osteoarthritis. Mild transitional joint
degeneration on the right.
IMPRESSION: L5 is a transitional vertebra.

L3-4: Disc bulge. Facet osteoarthritis right worse than left. Mild
stenosis of the subarticular lateral recesses but without visible
neural compression.

L4-5: Previous posterior decompression, diskectomy and fusion
surgery. I think there is solid union across the disc level
particularly posteriorly. Hypertrophic change of the facets, right
worse than left. No compressive canal stenosis. Bony foraminal
narrowing on the right but without likely compression of the exiting
L4 nerve.

L5-S1: Transitional level. No canal or foraminal stenosis. Mild
facet osteoarthritis. Degenerative change of the transitional
articulations, right more than left.

## 2022-05-27 IMAGING — CT CT CERVICAL SPINE W/O CM
2 series · 10 of 14 positions shown, 12 images · non-contrast
Comparison: Radiography 03/26/2011

CLINICAL DATA: Neck pain over the last 6 months.

EXAM:
CT CERVICAL SPINE WITHOUT CONTRAST
TECHNIQUE: Multidetector CT imaging of the cervical spine was performed without
intravenous contrast. Multiplanar CT image reconstructions were also
generated.

[Series 3: cspine soft · axial · 0.30mm/px · z∈[-197,-67]mm · 5 of 99 slices shown, 7 images]
[im 17/99  soft-tissue]
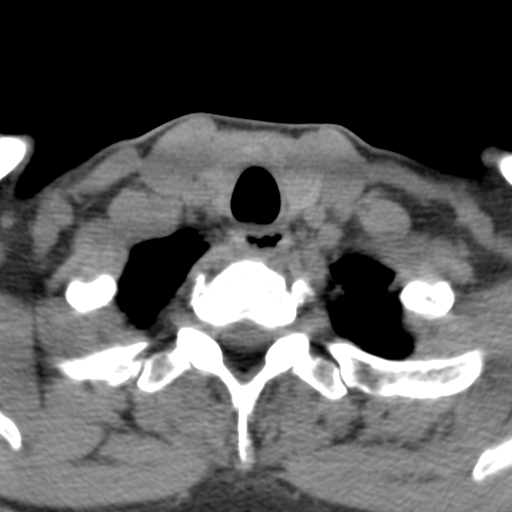
[im 17/99  bone]
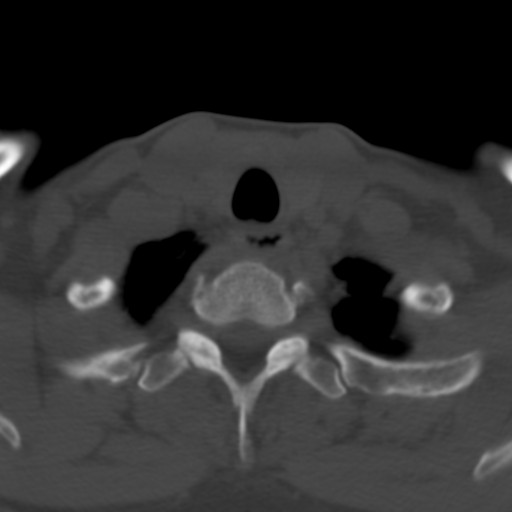
[im 33/99  bone]
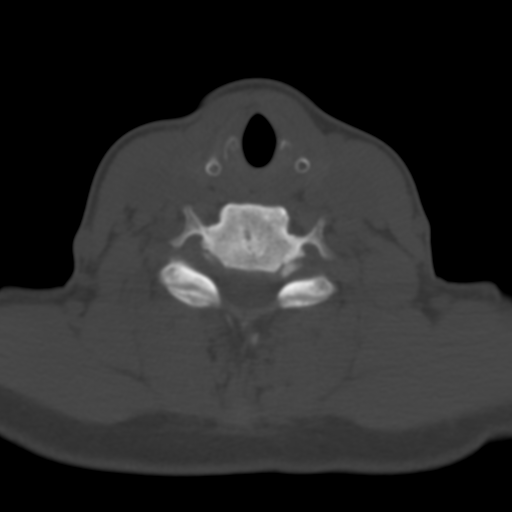
[im 50/99  bone]
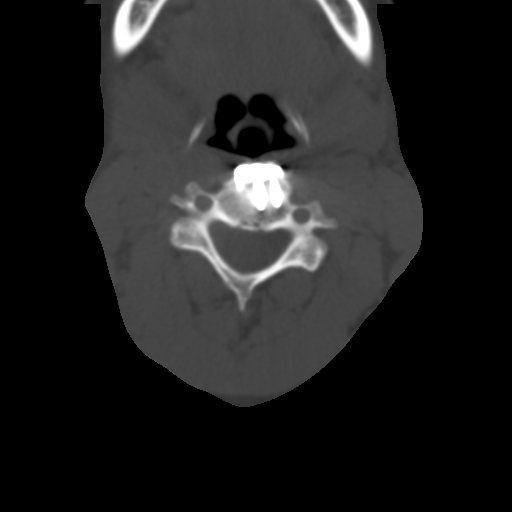
[im 66/99  bone]
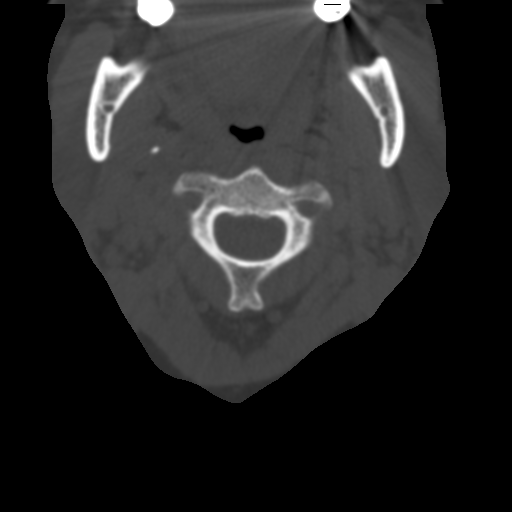
[im 82/99  soft-tissue]
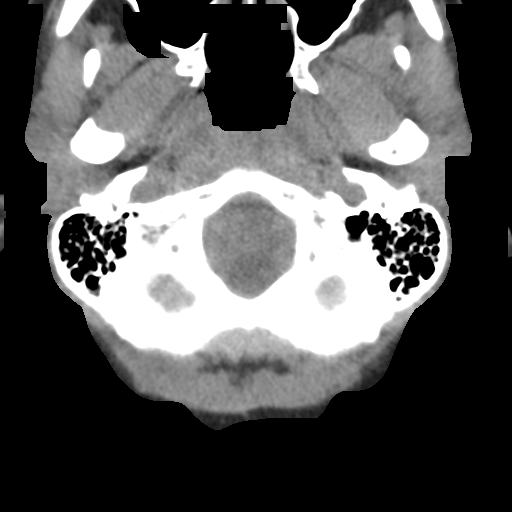
[im 82/99  bone]
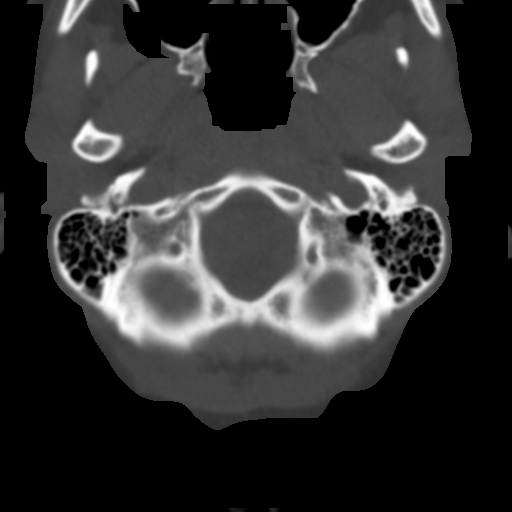

[Series 9: angled axial soft · axial · 0.30mm/px · z∈[-212,-83]mm · 5 of 100 slices shown]
[im 17/100  soft-tissue]
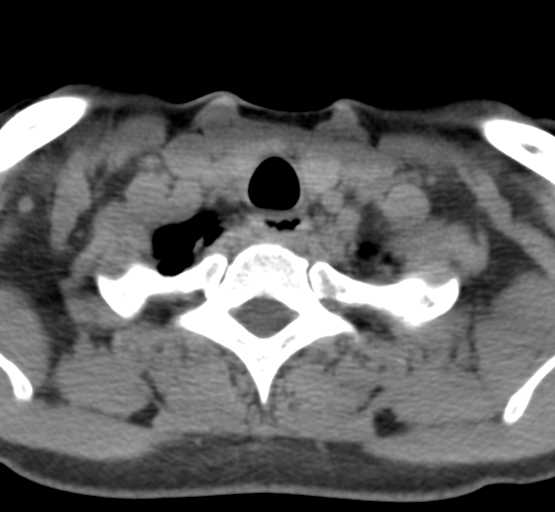
[im 34/100  soft-tissue]
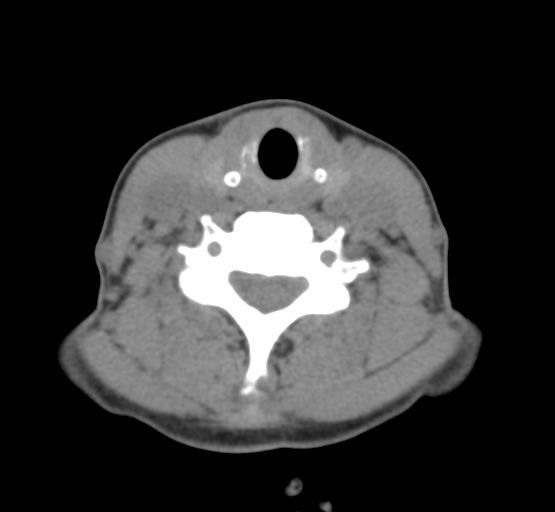
[im 50/100  soft-tissue]
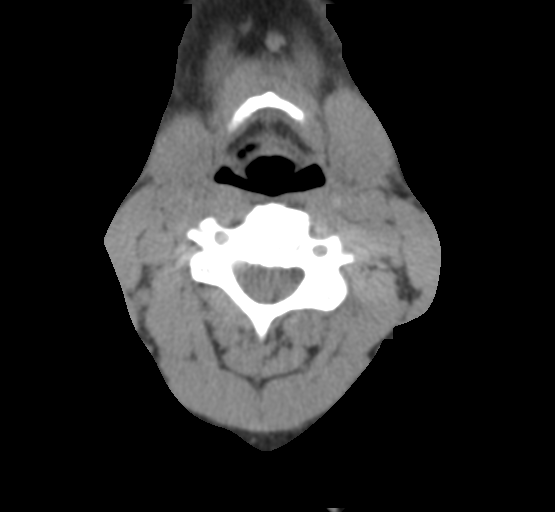
[im 67/100  soft-tissue]
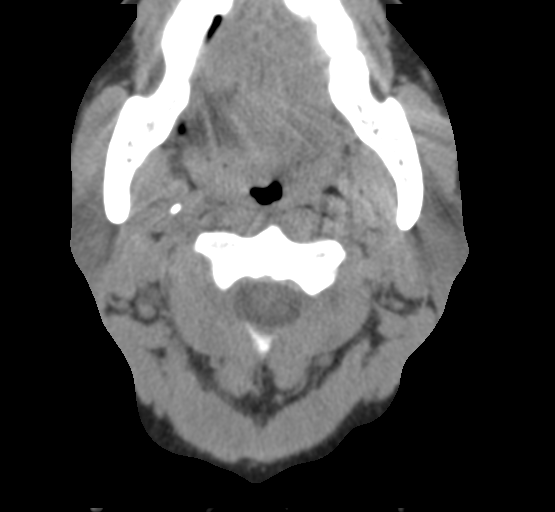
[im 83/100  soft-tissue]
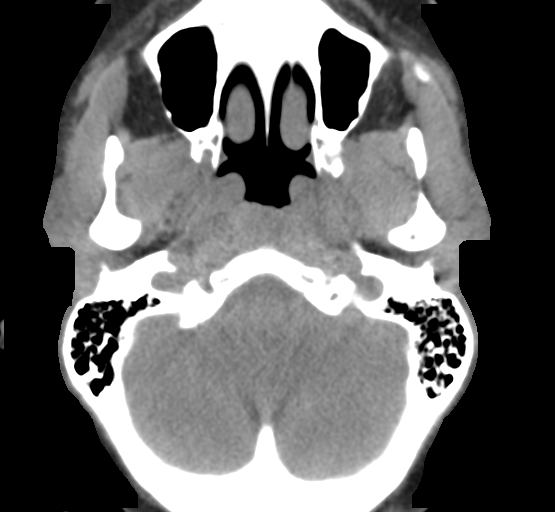

[10 of 14 positions shown; findings below may reference images not displayed]

FINDINGS: Alignment: No malalignment.

Skull base and vertebrae: No focal bone lesion. See below regarding
fusion.

Soft tissues and spinal canal: Negative

Disc levels: Foramen magnum is widely patent. Ordinary mild
osteoarthritis at the C1-2 articulation without encroachment upon
the neural structures.

C2-3: Minimal disc bulge. No facet arthropathy. No canal or
foraminal stenosis.

C3-4: Previous ACDF. No evidence of nonunion or hardware
complication. Facet fusion on the left. Mild bony foraminal
narrowing on the left, not likely compressive.

C4 through C6: Previous ACDF with solid union and wide patency of
the canal and foramina.

C6-7: Previous ACDF procedure. I think it is questionable if there
is solid union across the C6-7 level. Sagittal and coronal
reconstructed images do suggest solid bone across the disc space,
but there also seems to be mild lucency around the anchor screws of
the interbody spacer. Canal and foramina appear sufficiently patent,
with mild bony foraminal narrowing on the right.

C7-T1: Mild facet osteoarthritis.  No canal or foraminal stenosis.

T1-2: Normal.

Upper chest: Normal except for minimal scarring at the apices.

Other: None
IMPRESSION: Solid union from C3 through C6 with sufficient patency of the canal
and foramina. Mild bony foraminal narrowing at C3-4 on the left, not
likely compressive.

Previous ACDF procedure at C6-7. I think it is unclear if there is
definite solid union across this level. There does not appear to be
any compressive narrowing of the canal or foramina however. Mild
osteophytic narrowing of the foramen on the right.
